# Patient Record
Sex: Female | Born: 1990 | Race: White | Hispanic: No | Marital: Married | Smoking: Current every day smoker
Health system: Southern US, Community
[De-identification: ages and names within clinical notes are randomized; demographics above are authoritative.]

## PROBLEM LIST (undated history)

## (undated) DIAGNOSIS — N946 Dysmenorrhea, unspecified: Secondary | ICD-10-CM

## (undated) DIAGNOSIS — K7689 Other specified diseases of liver: Principal | ICD-10-CM

## (undated) DIAGNOSIS — F988 Other specified behavioral and emotional disorders with onset usually occurring in childhood and adolescence: Secondary | ICD-10-CM

## (undated) HISTORY — DX: Dysmenorrhea, unspecified: N94.6

## (undated) HISTORY — PX: WISDOM TOOTH EXTRACTION: SHX21

## (undated) HISTORY — DX: Other specified behavioral and emotional disorders with onset usually occurring in childhood and adolescence: F98.8

## (undated) HISTORY — PX: FOOT SURGERY: SHX648

## (undated) HISTORY — DX: Other specified diseases of liver: K76.89

---
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
## 2012-03-19 ENCOUNTER — Telehealth: Payer: Self-pay | Admitting: Gastroenterology

## 2012-03-19 ENCOUNTER — Encounter: Payer: Self-pay | Admitting: Gastroenterology

## 2012-03-19 NOTE — Telephone Encounter (Signed)
Sept recall has patient on list to consider repeat imaging 6-12 months per [facility]

## 2012-03-19 NOTE — Telephone Encounter (Signed)
Patients phone # has been disconnected so I mailed a letter

## 2012-07-24 ENCOUNTER — Telehealth: Payer: Self-pay

## 2012-07-24 NOTE — Telephone Encounter (Signed)
Pt's mom aware. Ok to call and schedule the OV appt.

## 2012-07-24 NOTE — Telephone Encounter (Signed)
PT NEEDS AN OPV IN 3-4 MOS TO DISCUSS THE BENEFITS V. RISKS OF GETTING AN MRI. THE LAST MRI WAS DONE WITH SPECIAL CONTRAST. SHE DOES NOT NEED ANOTHER MRI ACCORDING TO THE STUDY THAT WAS DONE IN DEC 2012.

## 2012-07-24 NOTE — Telephone Encounter (Signed)
pts mother called- pt was due in September for MRI of her liver. They received letter from [place], but pt was pregnant at that time. Pt had baby 3 weeks ago and wants to know if she still needs to have MRI done? Please advise.  pts phone number is [phone] (mom said pt was at home but they dont always have a good signal at their house) Mom's Marda Stalker) phone number is [phone]

## 2012-07-25 ENCOUNTER — Encounter: Payer: Self-pay | Admitting: Gastroenterology

## 2012-07-25 NOTE — Telephone Encounter (Signed)
Pt is aware of OV on 4/24 at 3 with SF and appt card was mailed

## 2012-10-24 ENCOUNTER — Ambulatory Visit: Payer: BC Managed Care – PPO | Admitting: Gastroenterology

## 2020-10-21 ENCOUNTER — Encounter: Payer: Self-pay | Admitting: Internal Medicine

## 2020-10-31 NOTE — Progress Notes (Signed)
Referring Provider: Estanislado Pandy, MD Primary Care Physician:  Estanislado Pandy, MD Primary Gastroenterologist:  Dr. Marletta Lor  Chief Complaint  Patient presents with  . Rectal Pain    Uncle passed away from colorectal cancer  . Anal Itching  . Hemorrhoids    Occ bleeding  . Constipation    occ    HPI:   Valerie Cox is a 30 y.o. female presenting today at the request of Sasser, Clarene Critchley, MD for anal fissure.   History of FNH. Initial MRI July 2012 with 3.9 cm lesion in right hepatic lobe with differentials including FNH, hepatic adenoma, and less likely fibrolamellar hepatocellular carcinoma. Repeat MRI in December 2012 with Eovist contrast confirmed FNH, no recommendations to continue to follow in the absence of hepatic symptoms.   Today:  Chronic rectal pain and itching since December. PCP prescribed 0.4% nitroglycerine and hydrocortisone rectal cream.  Nitroglycerin was started in March, and she used this twice daily x3 weeks.  While using this, her rectal pain improved somewhat.,  But states as soon as she discontinued medication, her rectal pain returned.  She did not notice any improvement in her rectal itching/burning while using nitroglycerin.  Did not notice much improvement with hydrocortisone cream.  She has not used any creams in a few weeks.  She does report while using nitroglycerin, she did have headaches, dizziness, and some heart palpitations.  Notably, she was applying a fairly large amount on the outside and inside her rectum.  In general, she has constant rectal pain.  After bowel movement, pain gets more severe.  It does feel sharp and knifelike.  Wiping is also painful.  Reports she has a prolapsed hemorrhoid since 2018.  She and her husband have engage in anal intercourse, but none since December when her symptoms flared up.  Prior to December, she would have occasional rectal pain/burning which improved with OTC hemorrhoid creams.  She also reports intermittent bright  red blood per rectum which occurs 2-3 times a week.  This has been occurring since 2016.  Usually, blood on toilet tissue, but she has had blood in the toilet water as well.  Also reports history of IBS since she was 20.  Bowels move daily.  Consistency varies depending on anxiety level.  When she is under a lot of stress or anxious, she has more frequent bowel movements that are loose.  No constipation typically.  Does not take anything for IBS.  She has been tried on Levsin in the past and did notice some improvement.  She would like to have something to help with bowel frequency as needed.  Denies abdominal pain.  FNH: Last OCPs in 2015. Currently on Mirena.   If OCP's recommend annual [place] for 2-3 years to confirm stability. Could consider [place] to confirm stability.   Past Medical History:  Diagnosis Date  . ADD (attention deficit disorder)   . Dysmenorrhea ? PCO on OCP since age 62  . 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 FOOT SURGERY  LEFT 2o to benign cyst   JAN 2010/2011  . WISDOM TOOTH EXTRACTION  x4    Current Outpatient Medications  Medication Sig Dispense Refill  . dicyclomine (BENTYL) 10 MG capsule Take 1 capsule (10 mg total) by mouth 4 (four) times daily -  before meals and at bedtime. 90 capsule 0  .  levonorgestrel (MIRENA) 20 MCG/DAY IUD 1 each by Intrauterine route once.     No current facility-administered medications for this visit.    Allergies as of 11/01/2020  . (No Known Allergies)    Family History  Problem Relation Age of Onset  . Heart disease Maternal Grandmother   . Heart disease Maternal Grandfather   . Diabetes Maternal Grandfather   . Heart disease Paternal Grandfather   . Colon cancer Maternal Uncle        diagnosed in his 69s.   . Colon polyps Neg Hx   . Liver cancer Neg Hx   . Inflammatory bowel disease Neg Hx     Social History    Socioeconomic History  . Marital status: Married    Spouse name: Not on file  . Number of children: Not on file  . Years of education: Not on file  . Highest education level: Not on file  Occupational History  . Not on file  Tobacco Use  . Smoking status: Current Every Day Smoker    Packs/day: 0.30    Types: Cigarettes  . Smokeless tobacco: Never Used  Substance and Sexual Activity  . Alcohol use: No  . Drug use: No  . Sexual activity: Not on file  Other Topics Concern  . Not on file  Social History Narrative   Works at [employer] Plans to study nursing at [employer].   Social Determinants of Health   Financial Resource Strain: Not on file  Food Insecurity: Not on file  Transportation Needs: Not on file  Physical Activity: Not on file  Stress: Not on file  Social Connections: Not on file  Intimate Partner Violence: Not on file    Review of Systems: Gen: Denies any fever, chills, cold or flulike symptoms, presyncope, syncope. CV: Denies chest pain or heart palpitations currently.  Noted history of heart palpitations when using nitroglycerin. Resp: Denies shortness of breath or cough. GI: See HPI GU : Denies urinary burning, urinary frequency, urinary hesitancy MS: Denies joint pain Derm: Denies rash Psych: History of anxiety.  Heme: See HPI  Physical Exam: BP 113/72   Pulse 81   Temp 97.7 F (36.5 C) (Temporal)   Ht 5\' 5"  (1.651 m)   Wt 133 lb (60.3 kg)   LMP 10/18/2020 Comment: IUD  BMI 22.13 kg/m  General:   Alert and oriented. Pleasant and cooperative. Well-nourished and well-developed.  Head:  Normocephalic and atraumatic. Eyes:  Without icterus, sclera clear and conjunctiva pink.  Ears:  Normal auditory acuity. Lungs:  Clear to auscultation bilaterally. No wheezes, rales, or rhonchi. No distress.  Heart:  S1, S2 present without murmurs appreciated.  Abdomen:  +BS, soft, non-tender and non-distended. No HSM noted. No guarding or  rebound. No masses appreciated.  Rectal:  External, non-thrombosed hemorrhoid. Few perianal excoriations.  No obvious anal fissure.  Internal exam with likely posterior internal hemorrhoid.  No masses or abscesses appreciated.  No bright red blood or melena on gloved exam finger. Msk:  Symmetrical without gross deformities. Normal posture. Extremities:  Without edema. Neurologic:  Alert and  oriented x4;  grossly normal neurologically. Skin:  Intact without significant lesions or rashes. Psych:  Normal mood and affect.    Assessment: 30 year old female with history of focal nodular hyperplasia of the liver, reported history of hemorrhoids and IBS with diarrhea presenting today for further evaluation of rectal pain and rectal itching at the request of her PCP.  Also reporting intermittent rectal bleeding  and intermittent diarrhea related to anxiety.   She reports rectal pain and rectal itching have been persistent since December 2021.  Sharp, knifelike rectal pain with bowel movements and intense itching otherwise.  Also with bright red blood per rectum with blood on toilet tissue and occasionally in toilet water 2-3 times a week which she reports is chronic since 2016.  No prior colonoscopy.  Previously treated with 0.4% nitroglycerin rectal cream twice daily x3 weeks and hydrocortisone rectal cream.  Noted slight improvement while using nitroglycerin, but symptoms returned as soon as she discontinued this medication.  On exam, she has external, nonthrombosed hemorrhoid, few perianal excoriations, no obvious fissure.  On internal exam, likely posterior internal hemorrhoid, no masses or abscesses, no BRBPR or melena on gloved exam finger.  Symptoms seem most consistent with anal fissure and likely compounded by hemorrhoid flare.  This may be irritated by intermittent frequent loose bowel movements related to stress/anxiety. Rectal bleeding likely secondary to hemorrhoids/fissure, but can't rule out  other source such as polyps, doubt malignancy.  Unfortunately, due to patient not having insurance, we are limited on medication options.  Compounded rectal cream is likely very expensive.  As she noted some improvement on nitroglycerin, we will try her on this again, but with an extended course. She will continue hydrocortisone rectal cream and advised she may also add lidocaine ointment as needed. Also pursuing colonoscopy for further evaluation.  Intermittent diarrhea: Chronic intermittent frequent loose bowel movements in the setting of anxiety.  Otherwise, with 1 bowel movement daily.  Also reports chronic intermittent bright red blood per rectum. Denies abdominal pain altogether.  Reports prior trial of Levsin years ago that was helpful, and she would like to have something to take on an as-needed basis.  No family history of IBD or colon cancer.  Suspect symptoms are likely secondary to IBS.  Less likely IBD.  Rectal bleeding likely secondary to hemorrhoids/anal fissure as per above.  We are pursuing colonoscopy for further evaluation of rectal bleeding and rectal pain.  This will also help to evaluate her diarrhea. Will trial Bentyl PRN.   Focal nodular hyperplasia: History of focal nodular hyperplasia of the liver confirmed by MRI in December 2012.  She has no hepatic symptoms.  In general, guidelines recommend annual ultrasound for 2-3 years to confirm stability if on OCPs.  She was on OCPs at the time of her diagnosis.  Last use OCPs in 2015.  Currently on Mirena.  We will order RUQ ultrasound to confirm stability.  Plan:  1.  Update CBC.   2.  Resume nitroglycerin rectal cream twice daily.  Recommend applying a very small, pea-sized amount externally to the anal opening, and lay down when applying this cream.  Advised to let me know if she has any significant adverse reactions including lightheadedness, headaches, heart palpitations, dizziness.  3.  Continue hydrocortisone rectal cream twice  daily.  4.  Try over-the-counter lidocaine ointment twice daily to help with rectal discomfort.  5.  Add Benefiber 3 teaspoons daily x2 weeks and increase to twice daily as tolerated.  6.  Limit toilet x2-3 minutes.  7.  Trial of dicyclomine 10 mg up to 3 times daily before meals and at bedtime.  8.  Proceed with colonoscopy with propofol with Dr. Marletta Lor in the near future for rectal pain, rectal bleeding. The risks, benefits, and alternatives have been discussed with the patient in detail. The patient states understanding and desires to proceed.  ASA II  9.  RUQ ultrasound to confirm stability of FNH.  10.  Follow-up after colonoscopy.      Valerie Lichtenberger, PA-C [facility] 11/01/2020  

## 2020-10-31 NOTE — H&P (View-Only) (Signed)
Referring Provider: Estanislado Pandy, MD Primary Care Physician:  Estanislado Pandy, MD Primary Gastroenterologist:  Dr. Marletta Lor  Chief Complaint  Patient presents with  . Rectal Pain    Uncle passed away from colorectal cancer  . Anal Itching  . Hemorrhoids    Occ bleeding  . Constipation    occ    HPI:   Valerie Cox is a 30 y.o. female presenting today at the request of Sasser, Clarene Critchley, MD for anal fissure.   History of FNH. Initial MRI July 2012 with 3.9 cm lesion in right hepatic lobe with differentials including FNH, hepatic adenoma, and less likely fibrolamellar hepatocellular carcinoma. Repeat MRI in December 2012 with Eovist contrast confirmed FNH, no recommendations to continue to follow in the absence of hepatic symptoms.   Today:  Chronic rectal pain and itching since December. PCP prescribed 0.4% nitroglycerine and hydrocortisone rectal cream.  Nitroglycerin was started in March, and she used this twice daily x3 weeks.  While using this, her rectal pain improved somewhat.,  But states as soon as she discontinued medication, her rectal pain returned.  She did not notice any improvement in her rectal itching/burning while using nitroglycerin.  Did not notice much improvement with hydrocortisone cream.  She has not used any creams in a few weeks.  She does report while using nitroglycerin, she did have headaches, dizziness, and some heart palpitations.  Notably, she was applying a fairly large amount on the outside and inside her rectum.  In general, she has constant rectal pain.  After bowel movement, pain gets more severe.  It does feel sharp and knifelike.  Wiping is also painful.  Reports she has a prolapsed hemorrhoid since 2018.  She and her husband have engage in anal intercourse, but none since December when her symptoms flared up.  Prior to December, she would have occasional rectal pain/burning which improved with OTC hemorrhoid creams.  She also reports intermittent bright  red blood per rectum which occurs 2-3 times a week.  This has been occurring since 2016.  Usually, blood on toilet tissue, but she has had blood in the toilet water as well.  Also reports history of IBS since she was 20.  Bowels move daily.  Consistency varies depending on anxiety level.  When she is under a lot of stress or anxious, she has more frequent bowel movements that are loose.  No constipation typically.  Does not take anything for IBS.  She has been tried on Levsin in the past and did notice some improvement.  She would like to have something to help with bowel frequency as needed.  Denies abdominal pain.  FNH: Last OCPs in 2015. Currently on Mirena.   If OCP's recommend annual [place] for 2-3 years to confirm stability. Could consider [place] to confirm stability.   Past Medical History:  Diagnosis Date  . ADD (attention deficit disorder)   . Dysmenorrhea ? PCO on OCP since age 62  . 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 FOOT SURGERY  LEFT 2o to benign cyst   JAN 2010/2011  . WISDOM TOOTH EXTRACTION  x4    Current Outpatient Medications  Medication Sig Dispense Refill  . dicyclomine (BENTYL) 10 MG capsule Take 1 capsule (10 mg total) by mouth 4 (four) times daily -  before meals and at bedtime. 90 capsule 0  .  levonorgestrel (MIRENA) 20 MCG/DAY IUD 1 each by Intrauterine route once.     No current facility-administered medications for this visit.    Allergies as of 11/01/2020  . (No Known Allergies)    Family History  Problem Relation Age of Onset  . Heart disease Maternal Grandmother   . Heart disease Maternal Grandfather   . Diabetes Maternal Grandfather   . Heart disease Paternal Grandfather   . Colon cancer Maternal Uncle        diagnosed in his 69s.   . Colon polyps Neg Hx   . Liver cancer Neg Hx   . Inflammatory bowel disease Neg Hx     Social History    Socioeconomic History  . Marital status: Married    Spouse name: Not on file  . Number of children: Not on file  . Years of education: Not on file  . Highest education level: Not on file  Occupational History  . Not on file  Tobacco Use  . Smoking status: Current Every Day Smoker    Packs/day: 0.30    Types: Cigarettes  . Smokeless tobacco: Never Used  Substance and Sexual Activity  . Alcohol use: No  . Drug use: No  . Sexual activity: Not on file  Other Topics Concern  . Not on file  Social History Narrative   Works at [employer] Plans to study nursing at [employer].   Social Determinants of Health   Financial Resource Strain: Not on file  Food Insecurity: Not on file  Transportation Needs: Not on file  Physical Activity: Not on file  Stress: Not on file  Social Connections: Not on file  Intimate Partner Violence: Not on file    Review of Systems: Gen: Denies any fever, chills, cold or flulike symptoms, presyncope, syncope. CV: Denies chest pain or heart palpitations currently.  Noted history of heart palpitations when using nitroglycerin. Resp: Denies shortness of breath or cough. GI: See HPI GU : Denies urinary burning, urinary frequency, urinary hesitancy MS: Denies joint pain Derm: Denies rash Psych: History of anxiety.  Heme: See HPI  Physical Exam: BP 113/72   Pulse 81   Temp 97.7 F (36.5 C) (Temporal)   Ht 5\' 5"  (1.651 m)   Wt 133 lb (60.3 kg)   LMP 10/18/2020 Comment: IUD  BMI 22.13 kg/m  General:   Alert and oriented. Pleasant and cooperative. Well-nourished and well-developed.  Head:  Normocephalic and atraumatic. Eyes:  Without icterus, sclera clear and conjunctiva pink.  Ears:  Normal auditory acuity. Lungs:  Clear to auscultation bilaterally. No wheezes, rales, or rhonchi. No distress.  Heart:  S1, S2 present without murmurs appreciated.  Abdomen:  +BS, soft, non-tender and non-distended. No HSM noted. No guarding or  rebound. No masses appreciated.  Rectal:  External, non-thrombosed hemorrhoid. Few perianal excoriations.  No obvious anal fissure.  Internal exam with likely posterior internal hemorrhoid.  No masses or abscesses appreciated.  No bright red blood or melena on gloved exam finger. Msk:  Symmetrical without gross deformities. Normal posture. Extremities:  Without edema. Neurologic:  Alert and  oriented x4;  grossly normal neurologically. Skin:  Intact without significant lesions or rashes. Psych:  Normal mood and affect.    Assessment: 30 year old female with history of focal nodular hyperplasia of the liver, reported history of hemorrhoids and IBS with diarrhea presenting today for further evaluation of rectal pain and rectal itching at the request of her PCP.  Also reporting intermittent rectal bleeding  and intermittent diarrhea related to anxiety.   She reports rectal pain and rectal itching have been persistent since December 2021.  Sharp, knifelike rectal pain with bowel movements and intense itching otherwise.  Also with bright red blood per rectum with blood on toilet tissue and occasionally in toilet water 2-3 times a week which she reports is chronic since 2016.  No prior colonoscopy.  Previously treated with 0.4% nitroglycerin rectal cream twice daily x3 weeks and hydrocortisone rectal cream.  Noted slight improvement while using nitroglycerin, but symptoms returned as soon as she discontinued this medication.  On exam, she has external, nonthrombosed hemorrhoid, few perianal excoriations, no obvious fissure.  On internal exam, likely posterior internal hemorrhoid, no masses or abscesses, no BRBPR or melena on gloved exam finger.  Symptoms seem most consistent with anal fissure and likely compounded by hemorrhoid flare.  This may be irritated by intermittent frequent loose bowel movements related to stress/anxiety. Rectal bleeding likely secondary to hemorrhoids/fissure, but can't rule out  other source such as polyps, doubt malignancy.  Unfortunately, due to patient not having insurance, we are limited on medication options.  Compounded rectal cream is likely very expensive.  As she noted some improvement on nitroglycerin, we will try her on this again, but with an extended course. She will continue hydrocortisone rectal cream and advised she may also add lidocaine ointment as needed. Also pursuing colonoscopy for further evaluation.  Intermittent diarrhea: Chronic intermittent frequent loose bowel movements in the setting of anxiety.  Otherwise, with 1 bowel movement daily.  Also reports chronic intermittent bright red blood per rectum. Denies abdominal pain altogether.  Reports prior trial of Levsin years ago that was helpful, and she would like to have something to take on an as-needed basis.  No family history of IBD or colon cancer.  Suspect symptoms are likely secondary to IBS.  Less likely IBD.  Rectal bleeding likely secondary to hemorrhoids/anal fissure as per above.  We are pursuing colonoscopy for further evaluation of rectal bleeding and rectal pain.  This will also help to evaluate her diarrhea. Will trial Bentyl PRN.   Focal nodular hyperplasia: History of focal nodular hyperplasia of the liver confirmed by MRI in December 2012.  She has no hepatic symptoms.  In general, guidelines recommend annual ultrasound for 2-3 years to confirm stability if on OCPs.  She was on OCPs at the time of her diagnosis.  Last use OCPs in 2015.  Currently on Mirena.  We will order RUQ ultrasound to confirm stability.  Plan:  1.  Update CBC.   2.  Resume nitroglycerin rectal cream twice daily.  Recommend applying a very small, pea-sized amount externally to the anal opening, and lay down when applying this cream.  Advised to let me know if she has any significant adverse reactions including lightheadedness, headaches, heart palpitations, dizziness.  3.  Continue hydrocortisone rectal cream twice  daily.  4.  Try over-the-counter lidocaine ointment twice daily to help with rectal discomfort.  5.  Add Benefiber 3 teaspoons daily x2 weeks and increase to twice daily as tolerated.  6.  Limit toilet x2-3 minutes.  7.  Trial of dicyclomine 10 mg up to 3 times daily before meals and at bedtime.  8.  Proceed with colonoscopy with propofol with Dr. Marletta Lor in the near future for rectal pain, rectal bleeding. The risks, benefits, and alternatives have been discussed with the patient in detail. The patient states understanding and desires to proceed.  ASA II  9.  RUQ ultrasound to confirm stability of FNH.  10.  Follow-up after colonoscopy.      Ermalinda MemosKristen Ronav Furney, PA-C [facility] 11/01/2020

## 2020-11-01 ENCOUNTER — Encounter: Payer: Self-pay | Admitting: *Deleted

## 2020-11-01 ENCOUNTER — Encounter: Payer: Self-pay | Admitting: Gastroenterology

## 2020-11-01 ENCOUNTER — Ambulatory Visit: Payer: Self-pay | Admitting: Gastroenterology

## 2020-11-01 VITALS — BP 113/72 | HR 81 | Temp 97.7°F | Ht 65.0 in | Wt 133.0 lb

## 2020-11-01 DIAGNOSIS — K625 Hemorrhage of anus and rectum: Secondary | ICD-10-CM

## 2020-11-01 DIAGNOSIS — K649 Unspecified hemorrhoids: Secondary | ICD-10-CM | POA: Insufficient documentation

## 2020-11-01 DIAGNOSIS — K7689 Other specified diseases of liver: Secondary | ICD-10-CM

## 2020-11-01 DIAGNOSIS — K6289 Other specified diseases of anus and rectum: Secondary | ICD-10-CM | POA: Insufficient documentation

## 2020-11-01 DIAGNOSIS — R197 Diarrhea, unspecified: Secondary | ICD-10-CM

## 2020-11-01 MED ORDER — DICYCLOMINE HCL 10 MG PO CAPS: 10.0000 mg | ORAL_CAPSULE | Freq: Three times a day (TID) | ORAL | 0 refills | Status: DC

## 2020-11-01 NOTE — Patient Instructions (Addendum)
Please have blood work completed at [facility].  Resume using nitroglycerin rectal cream 2 times daily.  Please lay down when applying this cream. *Apply only a very small, pea-sized amount externally to the anal opening. You do not need to apply this inside.  If you have lightheadedness, headaches, heart palpitations, feel dizzy, please let me know.  Continue using hydrocortisone rectal cream twice daily for hemorrhoid symptoms.  You may pick up over-the-counter lidocaine ointment to apply to your rectum twice daily as needed for rectal discomfort.  Add Benefiber 3 teaspoons daily x2 weeks and increase to twice daily as tolerated.  Limit toilet x2-3 minutes.  I have sent a medication called dicyclomine to your pharmacy.  You may take this up to 3 times daily before meals and at bedtime for diarrhea.  We will arrange to have a colonoscopy in the near future with Dr. Marletta Lor to further evaluate her rectal discomfort, rectal bleeding, and diarrhea.  We are also arranging for you to have an ultrasound of your liver to follow-up on focal nodular hyperplasia.  We will plan to see back in the office after your procedures.  Do not hesitate to call if you have any questions or concerns prior.  Ermalinda Memos, PA-C [facility]

## 2020-11-02 ENCOUNTER — Encounter: Payer: Self-pay | Admitting: Gastroenterology

## 2020-11-09 ENCOUNTER — Other Ambulatory Visit (HOSPITAL_COMMUNITY)
Admission: RE | Admit: 2020-11-09 | Discharge: 2020-11-09 | Disposition: A | Discharge status: Home / Self Care | Payer: Medicaid - Out of State | Source: Ambulatory Visit | Attending: Gastroenterology | Admitting: Gastroenterology

## 2020-11-09 ENCOUNTER — Ambulatory Visit (HOSPITAL_COMMUNITY)
Admission: RE | Admit: 2020-11-09 | Discharge: 2020-11-09 | Disposition: A | Discharge status: Home / Self Care | Payer: Self-pay | Source: Ambulatory Visit | Attending: Gastroenterology | Admitting: Gastroenterology

## 2020-11-09 ENCOUNTER — Other Ambulatory Visit: Payer: Self-pay

## 2020-11-09 DIAGNOSIS — K625 Hemorrhage of anus and rectum: Secondary | ICD-10-CM | POA: Insufficient documentation

## 2020-11-09 DIAGNOSIS — K7689 Other specified diseases of liver: Secondary | ICD-10-CM | POA: Insufficient documentation

## 2020-11-09 LAB — CBC WITH DIFFERENTIAL/PLATELET
Abs Immature Granulocytes: 0.02 10*3/uL (ref 0.00–0.07)
Basophils Absolute: 0 10*3/uL (ref 0.0–0.1)
Basophils Relative: 0 %
Eosinophils Absolute: 0.1 10*3/uL (ref 0.0–0.5)
Eosinophils Relative: 1 %
HCT: 39.4 % (ref 36.0–46.0)
Hemoglobin: 13.3 g/dL (ref 12.0–15.0)
Immature Granulocytes: 0 %
Lymphocytes Relative: 20 %
Lymphs Abs: 1.4 10*3/uL (ref 0.7–4.0)
MCH: 31.9 pg (ref 26.0–34.0)
MCHC: 33.8 g/dL (ref 30.0–36.0)
MCV: 94.5 fL (ref 80.0–100.0)
Monocytes Absolute: 0.5 10*3/uL (ref 0.1–1.0)
Monocytes Relative: 7 %
Neutro Abs: 5 10*3/uL (ref 1.7–7.7)
Neutrophils Relative %: 72 %
Platelets: 204 10*3/uL (ref 150–400)
RBC: 4.17 MIL/uL (ref 3.87–5.11)
RDW: 12.2 % (ref 11.5–15.5)
WBC: 7 10*3/uL (ref 4.0–10.5)
nRBC: 0 % (ref 0.0–0.2)

## 2020-11-09 NOTE — Progress Notes (Signed)
Dear Margo Aye,   [facility] news again, your ultrasound is unremarkable. There is no focal lesion seen. Therefore no further surveillance is needed.       Thank you, Heloise Beecham, CMA

## 2020-11-09 NOTE — Progress Notes (Signed)
Dear Margo Aye,   Good news!!! Your hemoglobin has remained within normal limits despite rectal bleeding. Your white blood cells also.   If you have any questions or concerns please call our office @ [phone].     Thank you, Heloise Beecham, CMA

## 2020-11-19 ENCOUNTER — Other Ambulatory Visit: Payer: Self-pay

## 2020-11-19 ENCOUNTER — Other Ambulatory Visit (HOSPITAL_COMMUNITY)
Admission: RE | Admit: 2020-11-19 | Discharge: 2020-11-19 | Disposition: A | Discharge status: Home / Self Care | Payer: Self-pay | Source: Ambulatory Visit | Attending: Internal Medicine | Admitting: Internal Medicine

## 2020-11-19 DIAGNOSIS — Z20822 Contact with and (suspected) exposure to covid-19: Secondary | ICD-10-CM | POA: Insufficient documentation

## 2020-11-19 DIAGNOSIS — Z01812 Encounter for preprocedural laboratory examination: Secondary | ICD-10-CM | POA: Insufficient documentation

## 2020-11-19 LAB — SARS CORONAVIRUS 2 (TAT 6-24 HRS): SARS Coronavirus 2: NEGATIVE

## 2020-11-19 LAB — PREGNANCY, URINE: Preg Test, Ur: NEGATIVE

## 2020-11-22 ENCOUNTER — Telehealth: Payer: Self-pay | Admitting: Internal Medicine

## 2020-11-22 ENCOUNTER — Encounter (HOSPITAL_COMMUNITY): Payer: Self-pay

## 2020-11-22 ENCOUNTER — Ambulatory Visit (HOSPITAL_COMMUNITY): Payer: Self-pay | Admitting: Anesthesiology

## 2020-11-22 ENCOUNTER — Ambulatory Visit (HOSPITAL_COMMUNITY)
Admission: RE | Admit: 2020-11-22 | Discharge: 2020-11-22 | Disposition: A | Discharge status: Home / Self Care | Payer: Self-pay | Attending: Internal Medicine | Admitting: Internal Medicine

## 2020-11-22 ENCOUNTER — Encounter (HOSPITAL_COMMUNITY)
Admission: RE | Disposition: A | Discharge status: Home / Self Care | Payer: Self-pay | Source: Home / Self Care | Attending: Internal Medicine

## 2020-11-22 ENCOUNTER — Other Ambulatory Visit: Payer: Self-pay

## 2020-11-22 DIAGNOSIS — Z800 Family history of malignant neoplasm of digestive organs: Secondary | ICD-10-CM | POA: Insufficient documentation

## 2020-11-22 DIAGNOSIS — K625 Hemorrhage of anus and rectum: Secondary | ICD-10-CM | POA: Insufficient documentation

## 2020-11-22 DIAGNOSIS — K644 Residual hemorrhoidal skin tags: Secondary | ICD-10-CM | POA: Insufficient documentation

## 2020-11-22 DIAGNOSIS — K7689 Other specified diseases of liver: Secondary | ICD-10-CM | POA: Insufficient documentation

## 2020-11-22 DIAGNOSIS — K6289 Other specified diseases of anus and rectum: Secondary | ICD-10-CM

## 2020-11-22 DIAGNOSIS — K649 Unspecified hemorrhoids: Secondary | ICD-10-CM

## 2020-11-22 DIAGNOSIS — Z793 Long term (current) use of hormonal contraceptives: Secondary | ICD-10-CM | POA: Insufficient documentation

## 2020-11-22 DIAGNOSIS — K580 Irritable bowel syndrome with diarrhea: Secondary | ICD-10-CM | POA: Insufficient documentation

## 2020-11-22 DIAGNOSIS — K648 Other hemorrhoids: Secondary | ICD-10-CM | POA: Insufficient documentation

## 2020-11-22 DIAGNOSIS — F17210 Nicotine dependence, cigarettes, uncomplicated: Secondary | ICD-10-CM | POA: Insufficient documentation

## 2020-11-22 DIAGNOSIS — G8929 Other chronic pain: Secondary | ICD-10-CM | POA: Insufficient documentation

## 2020-11-22 HISTORY — PX: COLONOSCOPY WITH PROPOFOL: SHX5780

## 2020-11-22 SURGERY — COLONOSCOPY WITH PROPOFOL
Anesthesia: General

## 2020-11-22 MED ORDER — PROPOFOL 10 MG/ML IV BOLUS
INTRAVENOUS | Status: DC | PRN
  Administered 2020-11-22: 50 mg via INTRAVENOUS
  Administered 2020-11-22: 100 mg via INTRAVENOUS
  Administered 2020-11-22: 50 mg via INTRAVENOUS
  Administered 2020-11-22: 30 mg via INTRAVENOUS

## 2020-11-22 MED ORDER — PROPOFOL 500 MG/50ML IV EMUL
INTRAVENOUS | Status: DC | PRN
  Administered 2020-11-22: 150 ug/kg/min via INTRAVENOUS

## 2020-11-22 MED ORDER — PROPOFOL 10 MG/ML IV BOLUS
INTRAVENOUS | Status: AC
  Filled 2020-11-22: qty 40

## 2020-11-22 MED ORDER — LACTATED RINGERS IV SOLN: INTRAVENOUS | Status: DC

## 2020-11-22 NOTE — Telephone Encounter (Signed)
Please refer patient to Dr. Young Berry for external hemorrhoid/rectal discomfort.  Thank you

## 2020-11-22 NOTE — Interval H&P Note (Signed)
History and Physical Interval Note:  11/22/2020 1:53 PM  Valerie Cox  has presented today for surgery, with the diagnosis of rectal bleeding, rectal pain, hemorrhoids.  The various methods of treatment have been discussed with the patient and family. After consideration of risks, benefits and other options for treatment, the patient has consented to  Procedure(s) with comments: COLONOSCOPY WITH PROPOFOL (N/A) - 3:00pm as a surgical intervention.  The patient's history has been reviewed, patient examined, no change in status, stable for surgery.  I have reviewed the patient's chart and labs.  Questions were answered to the patient's satisfaction.     Lanelle Bal

## 2020-11-22 NOTE — Addendum Note (Signed)
Addended by: Corrie Mckusick on: 11/22/2020 03:28 PM   Modules accepted: Orders

## 2020-11-22 NOTE — Op Note (Signed)
[facility] Patient Name: Valerie Cox Procedure Date: 11/22/2020 2:24 PM MRN: [id number] Date of Birth: 03-02-1991 Attending MD: Elon Alas. Abbey Chatters DO CSN: [id number] Age: 30 Admit Type: Outpatient Procedure:                Colonoscopy Indications:              Rectal bleeding, Rectal pain Providers:                Elon Alas. Abbey Chatters, DO, Janeece Riggers, RN, Raphael Gibney, Technician Referring MD:              Medicines:                See the Anesthesia note for documentation of the                            administered medications Complications:            No immediate complications. Estimated Blood Loss:     Estimated blood loss: none. Procedure:                Pre-Anesthesia Assessment:                           - The anesthesia plan was to use monitored                            anesthesia care (MAC).                           After obtaining informed consent, the colonoscope                            was passed under direct vision. Throughout the                            procedure, the patient's blood pressure, pulse, and                            oxygen saturations were monitored continuously. The                            [id number]-HQ190L ([id number]) scope was introduced through                            the anus and advanced to the the cecum, identified                            by appendiceal orifice and ileocecal valve. The                            colonoscopy was performed without difficulty. The                            patient tolerated the procedure well. The quality  of the bowel preparation was evaluated using the                            BBPS [facility] Bowel Preparation Scale) with scores                            of: Right Colon = 3, Transverse Colon = 3 and Left                            Colon = 3 (entire mucosa seen well with no residual                            staining, small fragments of stool  or opaque                            liquid). The total BBPS score equals 9. Scope In: 2:42:21 PM Scope Out: 2:55:05 PM Scope Withdrawal Time: 0 hours 6 minutes 13 seconds  Total Procedure Duration: 0 hours 12 minutes 44 seconds  Findings:      One external hemorrhoid found on perianal exam. Appeared this may have       been thrombosed at one point but I was unable to squeeze any clot out       today. No obvious anal fissure identified.      Non-bleeding internal hemorrhoids were found during retroflexion.      The terminal ileum appeared normal.      The entire examined colon appeared normal. Impression:               - Hemorrhoids found on perianal exam.                           - Non-bleeding internal hemorrhoids.                           - The examined portion of the ileum was normal.                           - The entire examined colon is normal.                           - No specimens collected. Moderate Sedation:      Per Anesthesia Care Recommendation:           - Patient has a contact number available for                            emergencies. The signs and symptoms of potential                            delayed complications were discussed with the                            patient. Return to normal activities tomorrow.  Written discharge instructions were provided to the                            patient.                           - Resume previous diet.                           - Continue present medications.                           - Await pathology results.                           - Repeat colonoscopy at age 35 or sooner if higher                            risk for screening purposes.                           - Will refer to surgery for possible                            hemorroidectomy. No obvious anal fissure seen on                            exam today. I would stop the topical NTG cream.                            Continue  on Anusol cream and sitz baths. Can                            consider hemorrhoid banding pending surgical                            evaluation Procedure Code(s):        --- Professional ---                           [phone], Colonoscopy, flexible; diagnostic, including                            collection of specimen(s) by brushing or washing,                            when performed (separate procedure) Diagnosis Code(s):        --- Professional ---                           K64.8, Other hemorrhoids                           K62.5, Hemorrhage of anus and rectum                           K62.89, Other  specified diseases of anus and rectum CPT copyright 2019 American Medical Association. All rights reserved. The codes documented in this report are preliminary and upon coder review may  be revised to meet current compliance requirements. Elon Alas. Abbey Chatters, DO [place] Abbey Chatters, DO 11/22/2020 3:08:03 PM This report has been signed electronically. Number of Addenda: 0

## 2020-11-22 NOTE — Discharge Instructions (Addendum)
Colonoscopy Discharge Instructions  Read the instructions outlined below and refer to this sheet in the next few weeks. These discharge instructions provide you with general information on caring for yourself after you leave the hospital. Your doctor may also give you specific instructions. While your treatment has been planned according to the most current medical practices available, unavoidable complications occasionally occur.   ACTIVITY  You may resume your regular activity, but move at a slower pace for the next 24 hours.   Take frequent rest periods for the next 24 hours.   Walking will help get rid of the air and reduce the bloated feeling in your belly (abdomen).   No driving for 24 hours (because of the medicine (anesthesia) used during the test).    Do not sign any important legal documents or operate any machinery for 24 hours (because of the anesthesia used during the test).  NUTRITION  Drink plenty of fluids.   You may resume your normal diet as instructed by your doctor.   Begin with a light meal and progress to your normal diet. Heavy or fried foods are harder to digest and may make you feel sick to your stomach (nauseated).   Avoid alcoholic beverages for 24 hours or as instructed.  MEDICATIONS  You may resume your normal medications unless your doctor tells you otherwise.  WHAT YOU CAN EXPECT TODAY  Some feelings of bloating in the abdomen.   Passage of more gas than usual.   Spotting of blood in your stool or on the toilet paper.  IF YOU HAD POLYPS REMOVED DURING THE COLONOSCOPY:  No aspirin products for 7 days or as instructed.   No alcohol for 7 days or as instructed.   Eat a soft diet for the next 24 hours.  FINDING OUT THE RESULTS OF YOUR TEST Not all test results are available during your visit. If your test results are not back during the visit, make an appointment with your caregiver to find out the results. Do not assume everything is normal if  you have not heard from your caregiver or the medical facility. It is important for you to follow up on all of your test results.  SEEK IMMEDIATE MEDICAL ATTENTION IF:  You have more than a spotting of blood in your stool.   Your belly is swollen (abdominal distention).   You are nauseated or vomiting.   You have a temperature over 101.   You have abdominal pain or discomfort that is severe or gets worse throughout the day.   Your colonoscopy revealed a small external hemorrhoid..  This may have been thrombosed with a clot at one point.  I did try to squeeze out any remaining clot today and it appeared clear.  You also have internal hemorrhoids as well.  I did not see any obvious anal fissures or tears.  Recommend stopping the nitroglycerin cream.  Continue on Anusol cream as well as sitz bath's.  I will refer you to surgery to talk about possibly getting this external hemorrhoid removed.  If they do not feel this would be beneficial, then we can consider hemorrhoid banding in our clinic for your internal hemorrhoids.  This should help with any bleeding or itching though internal hemorrhoids rarely cause pain.   I hope you have a great rest of your week!  Hennie Duos. Marletta Lor, D.O. Gastroenterology and Hepatology [facility]   Hemorrhoids Hemorrhoids are swollen veins that may develop:  In the butt (rectum). These are  called internal hemorrhoids.  Around the opening of the butt (anus). These are called external hemorrhoids. Hemorrhoids can cause pain, itching, or bleeding. Most of the time, they do not cause serious problems. They usually get better with diet changes, lifestyle changes, and other home treatments. What are the causes? This condition may be caused by:  Having trouble pooping (constipation).  Pushing hard (straining) to poop.  Watery poop (diarrhea).  Pregnancy.  Being very overweight (obese).  Sitting for long periods of time.  Heavy  lifting or other activity that causes you to strain.  Anal sex.  Riding a bike for a long period of time. What are the signs or symptoms? Symptoms of this condition include:  Pain.  Itching or soreness in the butt.  Bleeding from the butt.  Leaking poop.  Swelling in the area.  One or more lumps around the opening of your butt. How is this diagnosed? A doctor can often diagnose this condition by looking at the affected area. The doctor may also:  Do an exam that involves feeling the area with a gloved hand (digital rectal exam).  Examine the area inside your butt using a small tube (anoscope).  Order blood tests. This may be done if you have lost a lot of blood.  Have you get a test that involves looking inside the colon using a flexible tube with a camera on the end (sigmoidoscopy or colonoscopy). How is this treated? This condition can usually be treated at home. Your doctor may tell you to change what you eat, make lifestyle changes, or try home treatments. If these do not help, procedures can be done to remove the hemorrhoids or make them smaller. These may involve:  Placing rubber bands at the base of the hemorrhoids to cut off their blood supply.  Injecting medicine into the hemorrhoids to shrink them.  Shining a type of light energy onto the hemorrhoids to cause them to fall off.  Doing surgery to remove the hemorrhoids or cut off their blood supply. Follow these instructions at home: Eating and drinking  Eat foods that have a lot of fiber in them. These include whole grains, beans, nuts, fruits, and vegetables.  Ask your doctor about taking products that have added fiber (fibersupplements).  Reduce the amount of fat in your diet. You can do this by: ? Eating low-fat dairy products. ? Eating less red meat. ? Avoiding processed foods.  Drink enough fluid to keep your pee (urine) pale yellow.   Managing pain and swelling  Take a warm-water bath (sitz bath)  for 20 minutes to ease pain. Do this 3-4 times a day. You may do this in a bathtub or using a portable sitz bath that fits over the toilet.  If told, put ice on the painful area. It may be helpful to use ice between your warm baths. ? Put ice in a plastic bag. ? Place a towel between your skin and the bag. ? Leave the ice on for 20 minutes, 2-3 times a day.   General instructions  Take over-the-counter and prescription medicines only as told by your doctor. ? Medicated creams and medicines may be used as told.  Exercise often. Ask your doctor how much and what kind of exercise is best for you.  Go to the bathroom when you have the urge to poop. Do not wait.  Avoid pushing too hard when you poop.  Keep your butt dry and clean. Use wet toilet paper or moist towelettes after  pooping.  Do not sit on the toilet for a long time.  Keep all follow-up visits as told by your doctor. This is important. Contact a doctor if you:  Have pain and swelling that do not get better with treatment or medicine.  Have trouble pooping.  Cannot poop.  Have pain or swelling outside the area of the hemorrhoids. Get help right away if you have:  Bleeding that will not stop. Summary  Hemorrhoids are swollen veins in the butt or around the opening of the butt.  They can cause pain, itching, or bleeding.  Eat foods that have a lot of fiber in them. These include whole grains, beans, nuts, fruits, and vegetables.  Take a warm-water bath (sitz bath) for 20 minutes to ease pain. Do this 3-4 times a day. This information is not intended to replace advice given to you by your health care provider. Make sure you discuss any questions you have with your health care provider. Document Revised: 06/27/2018 Document Reviewed: 11/08/2017 Elsevier Patient Education  2021 [employer].

## 2020-11-22 NOTE — Transfer of Care (Signed)
Immediate Anesthesia Transfer of Care Note  Patient: Valerie Cox  Procedure(s) Performed: COLONOSCOPY WITH PROPOFOL (N/A )  Patient Location: Short Stay  Anesthesia Type:General  Level of Consciousness: awake  Airway & Oxygen Therapy: Patient Spontanous Breathing  Post-op Assessment: Report given to RN  Post vital signs: Reviewed  Last Vitals:  Vitals Value Taken Time  BP    Temp    Pulse    Resp    SpO2      Last Pain:  Vitals:   11/22/20 1329  TempSrc: Oral  PainSc: 0-No pain      Patients Stated Pain Goal: 8 (11/22/20 1329)  Complications: No complications documented.

## 2020-11-22 NOTE — Telephone Encounter (Addendum)
Referral sent to Dr. Jenkins/Bridges via Epic. 

## 2020-11-22 NOTE — Anesthesia Preprocedure Evaluation (Signed)
Anesthesia Evaluation  Patient identified by MRN, date of birth, ID band Patient awake    Reviewed: Allergy & Precautions, NPO status , Patient's Chart, lab work & pertinent test results  History of Anesthesia Complications Negative for: history of anesthetic complications  Airway Mallampati: I  TM Distance: >3 FB Neck ROM: Full    Dental  (+) Dental Advisory Given, Teeth Intact   Pulmonary Current SmokerPatient did not abstain from smoking.,    Pulmonary exam normal breath sounds clear to auscultation       Cardiovascular Exercise Tolerance: Good Normal cardiovascular exam Rhythm:Regular Rate:Normal     Neuro/Psych PSYCHIATRIC DISORDERS    GI/Hepatic negative GI ROS, Neg liver ROS,   Endo/Other  negative endocrine ROS  Renal/GU negative Renal ROS     Musculoskeletal   Abdominal   Peds  Hematology negative hematology ROS (+)   Anesthesia Other Findings   Reproductive/Obstetrics                             Anesthesia Physical Anesthesia Plan  ASA: II  Anesthesia Plan: General   Post-op Pain Management:    Induction: Intravenous  PONV Risk Score and Plan: Propofol infusion and TIVA  Airway Management Planned: Nasal Cannula and Natural Airway  Additional Equipment:   Intra-op Plan:   Post-operative Plan:   Informed Consent: I have reviewed the patients History and Physical, chart, labs and discussed the procedure including the risks, benefits and alternatives for the proposed anesthesia with the patient or authorized representative who has indicated his/her understanding and acceptance.       Plan Discussed with: CRNA and Surgeon  Anesthesia Plan Comments:         Anesthesia Quick Evaluation

## 2020-11-22 NOTE — Anesthesia Postprocedure Evaluation (Signed)
Anesthesia Post Note  Patient: [name]  Procedure(s) Performed: COLONOSCOPY WITH PROPOFOL (N/A )  Patient location during evaluation: Endoscopy Anesthesia Type: General Level of consciousness: awake and alert Pain management: pain level controlled Vital Signs Assessment: post-procedure vital signs reviewed and stable Respiratory status: spontaneous breathing Cardiovascular status: blood pressure returned to baseline and stable Postop Assessment: no apparent nausea or vomiting Anesthetic complications: no   No complications documented.   Last Vitals:  Vitals:   11/22/20 1329 11/22/20 1501  BP: (!) 121/92 104/83  Pulse:  80  Resp: 20 16  Temp: 37 C 36.6 C  SpO2: 100% 98%    Last Pain:  Vitals:   11/22/20 1501  TempSrc: Oral  PainSc: 0-No pain                 Valerie Cox

## 2020-11-30 ENCOUNTER — Ambulatory Visit: Payer: Self-pay | Admitting: General Surgery

## 2020-12-01 ENCOUNTER — Encounter (HOSPITAL_COMMUNITY): Payer: Self-pay | Admitting: Internal Medicine

## 2020-12-06 ENCOUNTER — Telehealth: Payer: Self-pay | Admitting: Internal Medicine

## 2020-12-06 NOTE — Telephone Encounter (Signed)
Pt has questions about her procedure she had done recently. Please call [phone]

## 2020-12-07 NOTE — Telephone Encounter (Signed)
Returned pt's call LMOVM

## 2020-12-09 ENCOUNTER — Ambulatory Visit: Payer: Self-pay | Admitting: Internal Medicine

## 2020-12-09 NOTE — Telephone Encounter (Signed)
Phoned the pt and LMOVM for the pt to return

## 2020-12-09 NOTE — Telephone Encounter (Signed)
Letter mailed out the patient

## 2020-12-09 NOTE — Telephone Encounter (Signed)
Pt called in with a few questions and concerns about what happened after colonoscopy and will the surgery that you are referring her out for fix the issue that she is having with intense burning and itching. Also right now the pt doesn't have any insurance and she is still paying off some medical bills. Pt wants to know what she can do in the mean time to help relieve this intense burning and itching. She stated after colonoscopy she really doesn't remember what you advised her.

## 2020-12-13 ENCOUNTER — Telehealth: Payer: Self-pay | Admitting: Internal Medicine

## 2020-12-13 NOTE — Telephone Encounter (Signed)
I have attempted to call patient twice this a.m. with no response.  I would recommend that she do sitz bath's regularly and use Anusol cream.  Thank you

## 2020-12-13 NOTE — Telephone Encounter (Signed)
Dr. Marletta Lor addressed

## 2020-12-13 NOTE — Telephone Encounter (Signed)
noted 

## 2020-12-13 NOTE — Telephone Encounter (Signed)
Phoned and LMOVM regarding Dr. Darolyn Rua instructions and that I will call back tomorrow

## 2020-12-13 NOTE — Telephone Encounter (Signed)
I was finally able to get a hold of patient.  She is already doing sitz bath's and using Anusol cream regularly without improvement in her symptoms.  She complains of severe rectal itching.  I have recommended that she try over-the-counter capsaicin 3 times daily and monitor for improvement.  I did warn her of potential burning especially the first couple days when she applies this medication.  I have also recommended that she see a dermatologist as she may need biopsy.  Unclear if all her symptoms are really coming from hemorrhoids.

## 2020-12-15 ENCOUNTER — Ambulatory Visit: Payer: Self-pay | Admitting: Internal Medicine

## 2021-02-14 ENCOUNTER — Telehealth: Payer: Self-pay | Admitting: Internal Medicine

## 2021-02-14 NOTE — Telephone Encounter (Signed)
Referral faxed per pt request

## 2021-02-14 NOTE — Telephone Encounter (Signed)
Pt said she wanted her referral sent to [facility] in [place]. Fax [phone]  Any questions call patient at [phone]

## 2021-09-05 IMAGING — US US ABDOMEN LIMITED RUQ/ASCITES
1 series · 14 of 25 positions shown · non-contrast
Comparison: None.

CLINICAL DATA: Focal nodular hyperplasia of the liver.

EXAM:
ULTRASOUND ABDOMEN LIMITED RIGHT UPPER QUADRANT

[Series 1: us abdomen limited ruq (liver/gb) · 14 of 72 slices shown]
[im 1/72]
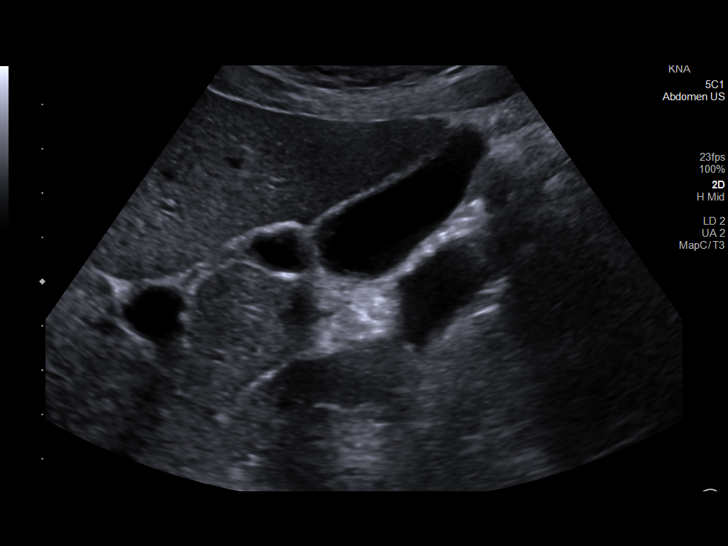
[im 6/72]
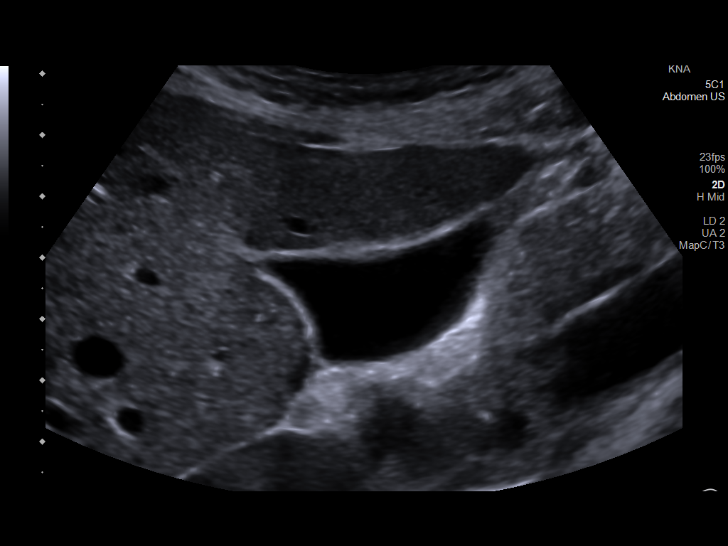
[im 12/72]
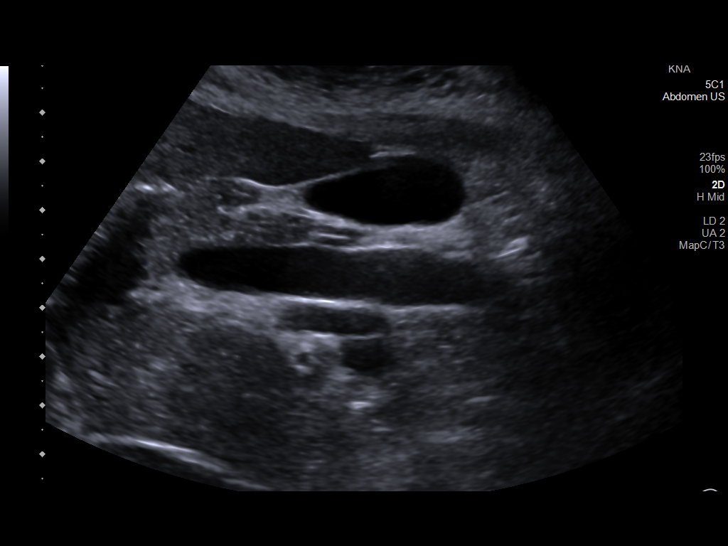
[im 18/72]
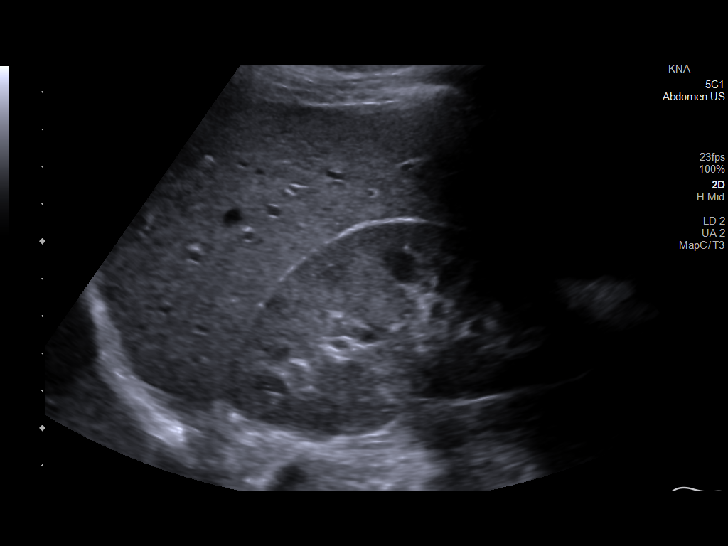
[im 24/72]
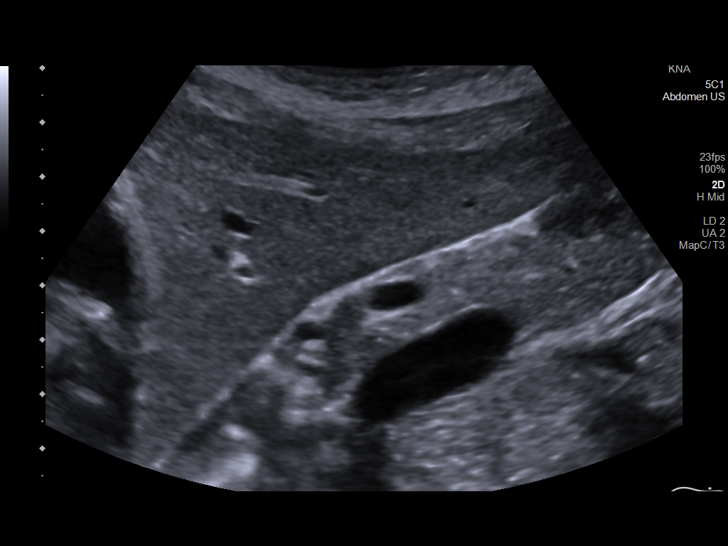
[im 27/72]
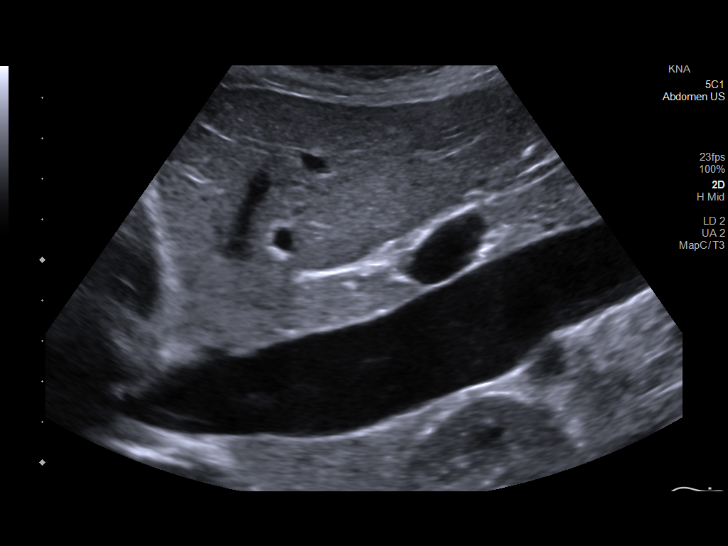
[im 33/72]
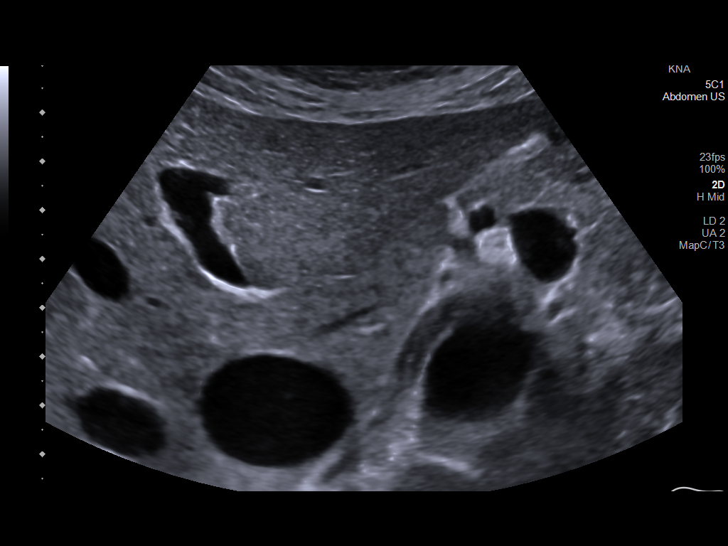
[im 39/72]
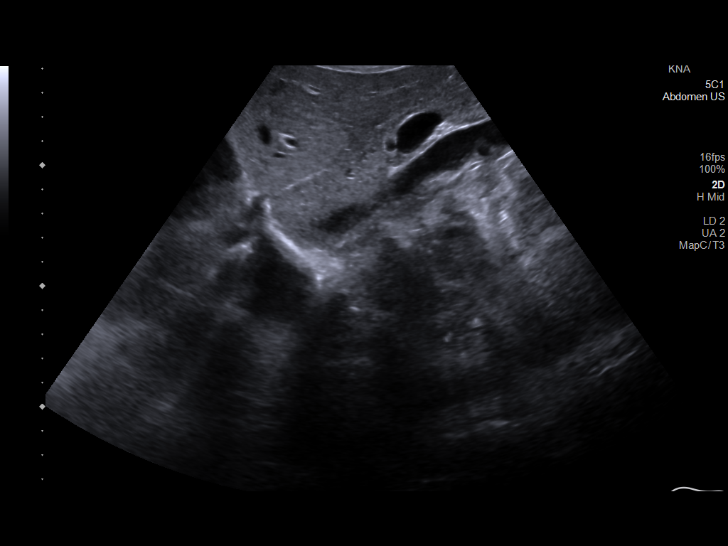
[im 45/72]
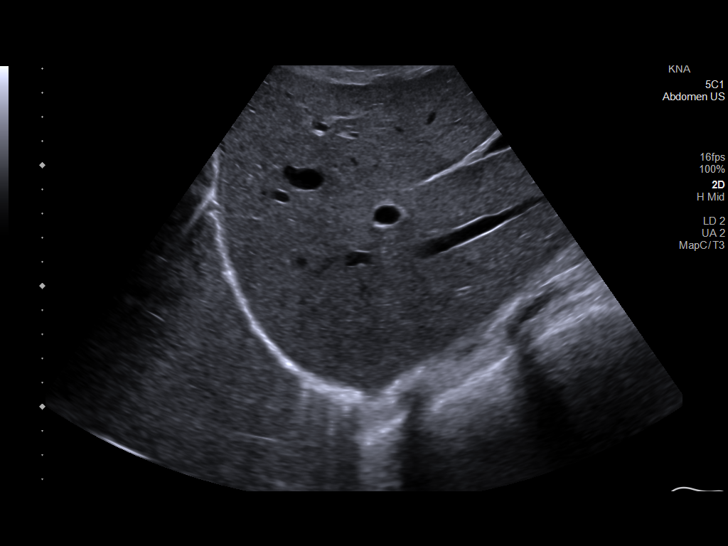
[im 48/72]
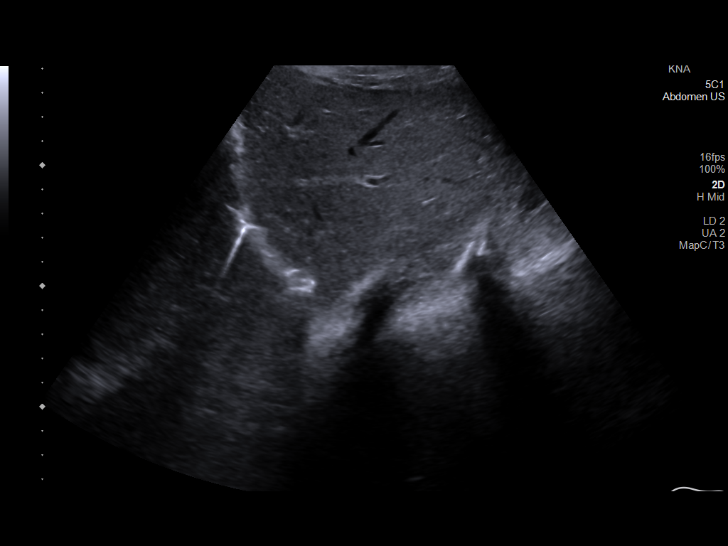
[im 54/72]
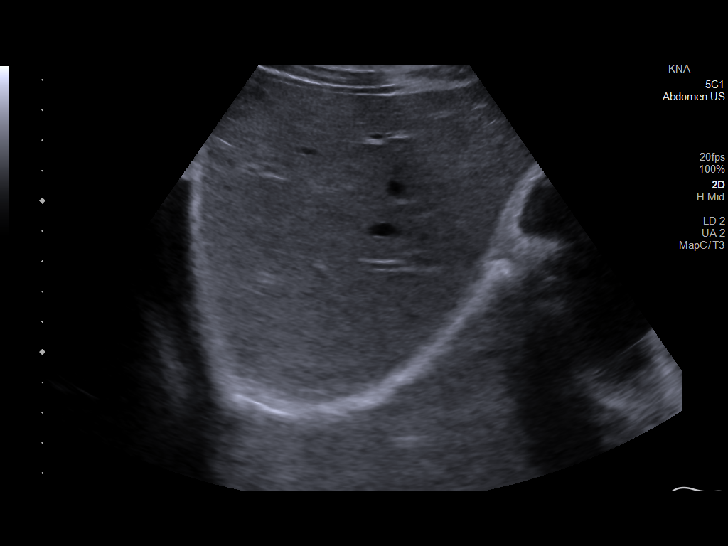
[im 60/72]
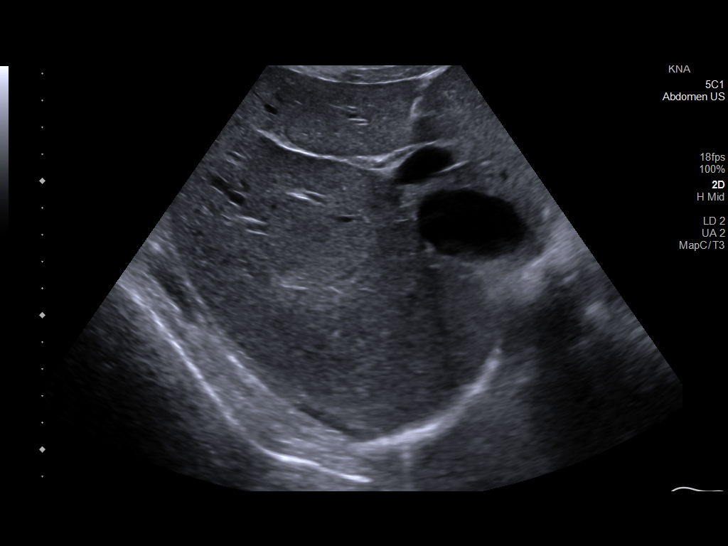
[im 66/72]
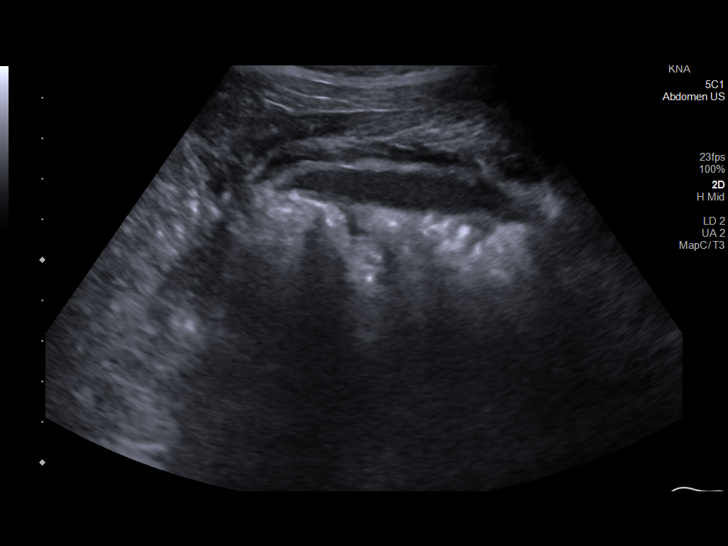
[im 72/72]
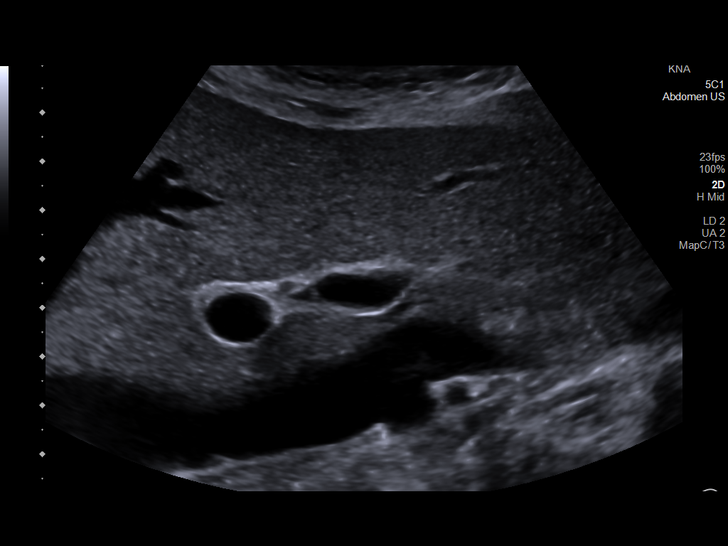

[14 of 25 positions shown; findings below may reference images not displayed]

FINDINGS: Gallbladder:

No gallstones or wall thickening visualized. No sonographic Murphy
sign noted by sonographer.

Common bile duct:

Diameter: 0.2 cm, within normal limits.

Liver:

No focal lesion identified. Within normal limits in parenchymal
echogenicity. Portal vein is patent on color Doppler imaging with
normal direction of blood flow towards the liver.

Other: None.
IMPRESSION: Unremarkable sonographic exam of the right upper quadrant.
